# Patient Record
Sex: Female | Born: 2000 | Race: Black or African American | Hispanic: No | Marital: Single | Smoking: Never smoker
Health system: Southern US, Community
[De-identification: ages and names within clinical notes are randomized; demographics above are authoritative.]

## PROBLEM LIST (undated history)

## (undated) DIAGNOSIS — G43909 Migraine, unspecified, not intractable, without status migrainosus: Secondary | ICD-10-CM

## (undated) HISTORY — PX: WISDOM TOOTH EXTRACTION: SHX21

---
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
## 2022-02-27 IMAGING — DX DG CHEST 1V PORT
1 series · 1 of 1 positions shown · non-contrast
Comparison: None.

CLINICAL DATA: Cough.

EXAM:
PORTABLE CHEST 1 VIEW

[chest ap]
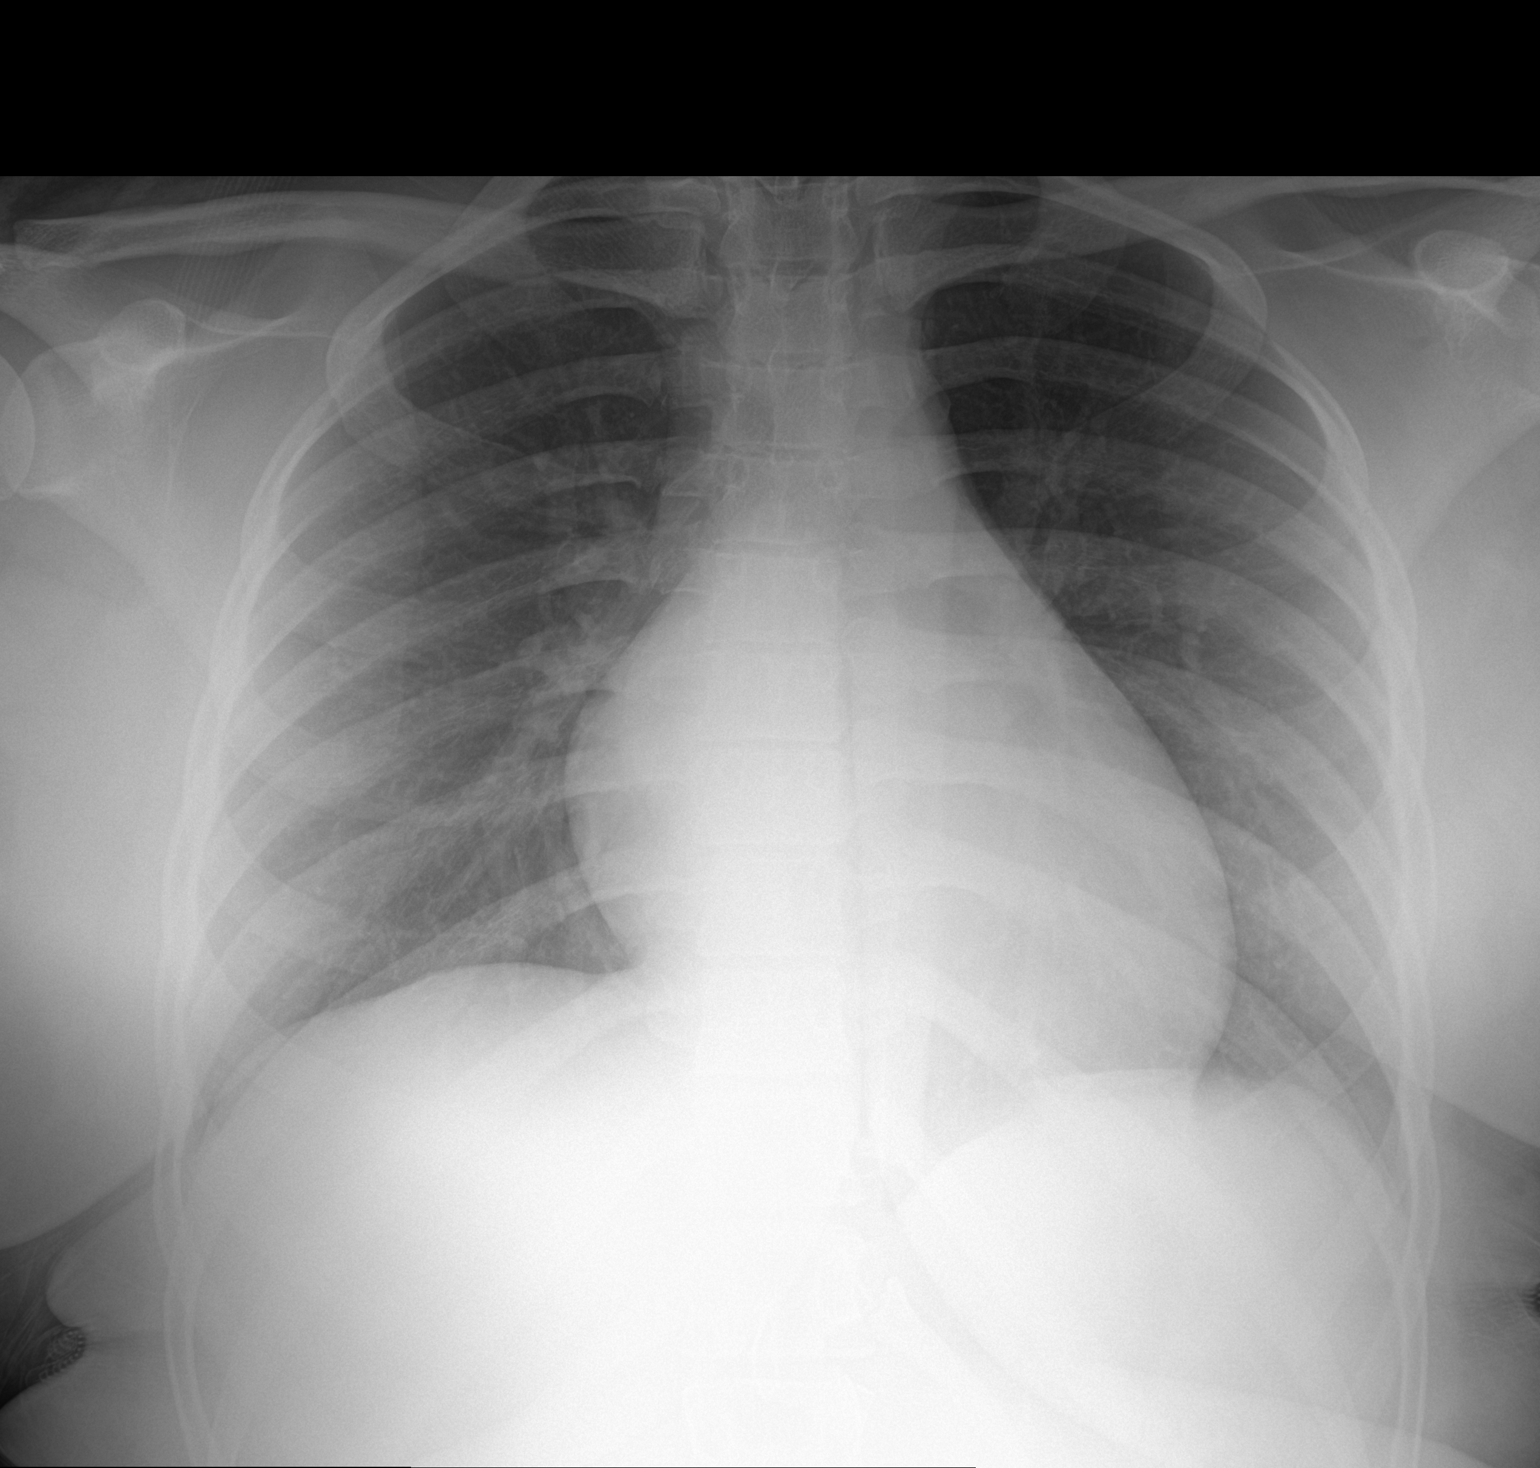

[1 of 1 positions shown; findings below may reference images not displayed]

FINDINGS: Cardiac silhouette is accentuated by AP portable technique. No
consolidation. No visible pleural effusions or pneumothorax. The
visualized skeletal structures are unremarkable.
IMPRESSION: No acute cardiopulmonary disease.
